# Patient Record
Sex: Female | Born: 1975 | Race: White | Hispanic: No | Marital: Single | Smoking: Current every day smoker
Health system: Southern US, Community
[De-identification: ages and names within clinical notes are randomized; demographics above are authoritative.]

## PROBLEM LIST (undated history)

## (undated) HISTORY — PX: TONSILLECTOMY: SUR1361

## (undated) HISTORY — PX: APPENDECTOMY: SHX54

---

## 2018-12-12 ENCOUNTER — Other Ambulatory Visit: Payer: Self-pay

## 2018-12-12 DIAGNOSIS — Z20822 Contact with and (suspected) exposure to covid-19: Secondary | ICD-10-CM

## 2018-12-13 LAB — NOVEL CORONAVIRUS, NAA: SARS-CoV-2, NAA: NOT DETECTED

## 2019-04-11 ENCOUNTER — Ambulatory Visit: Payer: Self-pay

## 2019-05-30 ENCOUNTER — Inpatient Hospital Stay (HOSPITAL_COMMUNITY)
Admission: AD | Admit: 2019-05-30 | Discharge: 2019-05-30 | Disposition: A | Discharge status: Home / Self Care | Payer: Medicaid Other | Attending: Obstetrics and Gynecology | Admitting: Obstetrics and Gynecology

## 2019-05-30 ENCOUNTER — Other Ambulatory Visit: Payer: Self-pay

## 2019-05-30 ENCOUNTER — Encounter (HOSPITAL_COMMUNITY): Payer: Self-pay | Admitting: Obstetrics and Gynecology

## 2019-05-30 ENCOUNTER — Inpatient Hospital Stay (HOSPITAL_COMMUNITY): Payer: Medicaid Other

## 2019-05-30 DIAGNOSIS — Z3A01 Less than 8 weeks gestation of pregnancy: Secondary | ICD-10-CM | POA: Diagnosis not present

## 2019-05-30 DIAGNOSIS — O039 Complete or unspecified spontaneous abortion without complication: Secondary | ICD-10-CM | POA: Diagnosis not present

## 2019-05-30 DIAGNOSIS — F17210 Nicotine dependence, cigarettes, uncomplicated: Secondary | ICD-10-CM | POA: Diagnosis not present

## 2019-05-30 LAB — CBC
HCT: 36.7 % (ref 36.0–46.0)
Hemoglobin: 12.5 g/dL (ref 12.0–15.0)
MCH: 30.9 pg (ref 26.0–34.0)
MCHC: 34.1 g/dL (ref 30.0–36.0)
MCV: 90.8 fL (ref 80.0–100.0)
Platelets: 233 10*3/uL (ref 150–400)
RBC: 4.04 MIL/uL (ref 3.87–5.11)
RDW: 11.9 % (ref 11.5–15.5)
WBC: 7.4 10*3/uL (ref 4.0–10.5)
nRBC: 0 % (ref 0.0–0.2)

## 2019-05-30 LAB — TYPE AND SCREEN
ABO/RH(D): O NEG
Antibody Screen: POSITIVE

## 2019-05-30 LAB — HCG, QUANTITATIVE, PREGNANCY: hCG, Beta Chain, Quant, S: 14798 m[IU]/mL — ABNORMAL HIGH (ref <5)

## 2019-05-30 MED ORDER — ACETAMINOPHEN 500 MG PO TABS
1000.0000 mg | ORAL_TABLET | Freq: Once | ORAL | Status: AC
  Administered 2019-05-30: 1000 mg via ORAL
  Filled 2019-05-30: qty 2

## 2019-05-30 NOTE — MAU Provider Note (Signed)
History     CSN: [id number]  Arrival date and time: 05/30/19 1830  Chief Complaint  Patient presents with  . Vaginal Bleeding   HPI Erika Bryant is a 44 y.o. [id number] who presents with heavy vaginal bleeding. She was diagnosed with a missed AB on 5/19 that measured approximately 7 weeks. She chose expectant management and started bleeding heavily today. She reports passing golfball sized clots.  OB History    Gravida  6   Para  4   Term  4   Preterm      AB  1   Living  4     SAB  1   TAB      Ectopic      Multiple      Live Births  4           History reviewed. No pertinent past medical history.  Past Surgical History:  Procedure Laterality Date  . APPENDECTOMY    . TONSILLECTOMY      History reviewed. No pertinent family history.  Social History   Tobacco Use  . Smoking status: Current Every Day Smoker    Years: 20.00    Types: Cigarettes  . Smokeless tobacco: Never Used  Substance Use Topics  . Alcohol use: Not Currently  . Drug use: Never    Allergies: Not on File  No medications prior to admission.    Review of Systems  Constitutional: Negative.  Negative for fatigue and fever.  HENT: Negative.   Respiratory: Negative.  Negative for shortness of breath.   Cardiovascular: Negative.  Negative for chest pain.  Gastrointestinal: Positive for abdominal pain. Negative for constipation, diarrhea, nausea and vomiting.  Genitourinary: Positive for vaginal bleeding. Negative for dysuria.  Neurological: Negative.  Negative for dizziness and headaches.   Physical Exam   Blood pressure 110/62, pulse (!) 111, temperature 98.4 F (36.9 C), resp. rate 18.  Physical Exam  Nursing note and vitals reviewed. Constitutional: She is oriented to person, place, and time. She appears well-developed and well-nourished. No distress.  HENT:  Head: Normocephalic.  Eyes: Pupils are equal, round, and reactive to light.  Cardiovascular: Normal rate,  regular rhythm and normal heart sounds.  Respiratory: Effort normal and breath sounds normal. No respiratory distress.  GI: Soft. Bowel sounds are normal. She exhibits no distension. There is no abdominal tenderness.  Genitourinary:    Genitourinary Comments: Moderate amount of bright red blood and clots evacuated from vaginal vault. Small trickle of blood from cervical os.    Neurological: She is alert and oriented to person, place, and time.  Skin: Skin is warm and dry.  Psychiatric: She has a normal mood and affect. Her behavior is normal. Judgment and thought content normal.    MAU Course  Procedures Results for orders placed or performed during the hospital encounter of 05/30/19 (from the past 24 hour(s))  CBC     Status: None   Collection Time: 05/30/19  7:10 PM  Result Value Ref Range   WBC 7.4 4.0 - 10.5 K/uL   RBC 4.04 3.87 - 5.11 MIL/uL   Hemoglobin 12.5 12.0 - 15.0 g/dL   HCT 36.7 36.0 - 46.0 %   MCV 90.8 80.0 - 100.0 fL   MCH 30.9 26.0 - 34.0 pg   MCHC 34.1 30.0 - 36.0 g/dL   RDW 11.9 11.5 - 15.5 %   Platelets 233 150 - 400 K/uL   nRBC 0.0 0.0 - 0.2 %  hCG, quantitative, pregnancy  Status: Abnormal   Collection Time: 05/30/19  7:10 PM  Result Value Ref Range   hCG, Beta Chain, Quant, S 14,798 (H) <5 mIU/mL  Type and screen [facility]     Status: None (Preliminary result)   Collection Time: 05/30/19  7:10 PM  Result Value Ref Range   ABO/RH(D) O NEG    Antibody Screen POS    Sample Expiration      06/02/2019,2359 Performed at [facility], [address] [place], [place] [address]    Antibody Identification PENDING    US OB Transvaginal  Result Date: 05/30/2019 CLINICAL DATA:  Heavy bleeding EXAM: OBSTETRIC <14 WK [place] AND TRANSVAGINAL OB US TECHNIQUE: Both transabdominal and transvaginal ultrasound examinations were performed for complete evaluation of the gestation as well as the maternal uterus, adnexal regions, and pelvic cul-de-sac.  Transvaginal technique was performed to assess early pregnancy. COMPARISON:  None. FINDINGS: Intrauterine gestational sac: None Maternal uterus/adnexae: The ovaries are unremarkable. Endometrium appears thickened with some mild vascularity. There is a trace amount of pelvic free fluid. IMPRESSION: 1. No IUP identified. 2. Thickened, heterogeneous endometrium. Findings are nonspecific and could indicate endometrial blood products although hypovascular retained products of conception cannot be fully excluded. Consider short-term follow-up pelvic ultrasound or further evaluation with pelvic MRI without and with IV contrast, as clinically warranted. Electronically Signed   By: Katherine Mantle M.D.   On: 05/30/2019 20:14   MDM Results reviewed from visit on 5/19. UA CBC HCG Type and screen- O Neg, received Rhogam in the office on 5/19 US OB transvaginal  Reviewed results with patient at length. Patient reassured by bloodwork and ultrasound. Discussed options of cytotec and surgery and patient verbalized understanding but desires to continue expectant management. Warning signs reviewed at length with patient including when to return for bleeding or pain. Patient has follow up already scheduled in office early next week.   Assessment and Plan   1. Miscarriage    -Discharge home in stable condition -Vaginal bleeding and pain precautions discussed -Patient advised to follow-up with [facility] as scheduled next week -Patient may return to MAU as needed or if her condition were to change or worsen   Rolm Bookbinder CNM 05/30/2019, 8:54 PM

## 2019-05-30 NOTE — MAU Note (Signed)
Erika Bryant is a 44 y.o.  here in MAU reporting:  That she was seen on Wednesday and told that she was having a miscarriage. +vaginal bleeding  Started on Sunday. Has progressively gotten heavier. Today is soaking through pads into her pants.  Reports that she is passing large amounts of tissue.  +dizziness.  LMP: 3/15  Pain score: 5/10. Has not taken anything for the pain.  Vitals:   05/30/19 1847  BP: (!) 147/95  Pulse: (!) 111  Resp: 18

## 2019-05-30 NOTE — Discharge Instructions (Signed)
Miscarriage A miscarriage is the loss of an unborn baby (fetus) before the 20th week of pregnancy. Follow these instructions at home: Medicines   Take over-the-counter and prescription medicines only as told by your doctor.  If you were prescribed antibiotic medicine, take it as told by your doctor. Do not stop taking the antibiotic even if you start to feel better.  Do not take NSAIDs unless your doctor says that this is safe for you. NSAIDs include aspirin and ibuprofen. These medicines can cause bleeding. Activity  Rest as directed. Ask your doctor what activities are safe for you.  Have someone help you at home during this time. General instructions  Write down how many pads you use each day and how soaked they are.  Watch the amount of tissue or clumps of blood (blood clots) that you pass from your vagina. Save any large amounts of tissue for your doctor.  Do not use tampons, douche, or have sex until your doctor approves.  To help you and your partner with the process of grieving, talk with your doctor or seek counseling.  When you are ready, meet with your doctor to talk about steps you should take for your health. Also, talk with your doctor about steps to take to have a healthy pregnancy in the future.  Keep all follow-up visits as told by your doctor. This is important. Contact a doctor if:  You have a fever or chills.  You have vaginal discharge that smells bad.  You have more bleeding. Get help right away if:  You have very bad cramps or pain in your back or belly.  You pass clumps of blood that are walnut-sized or larger from your vagina.  You pass tissue that is walnut-sized or larger from your vagina.  You soak more than 1 regular pad in an hour.  You get light-headed or weak.  You faint (pass out).  You have feelings of sadness that do not go away, or you have thoughts of hurting yourself. Summary  A miscarriage is the loss of an unborn baby before  the 20th week of pregnancy.  Follow your doctor's instructions for home care. Keep all follow-up appointments.  To help you and your partner with the process of grieving, talk with your doctor or seek counseling. This information is not intended to replace advice given to you by your health care provider. Make sure you discuss any questions you have with your health care provider. Document Revised: 04/18/2018 Document Reviewed: 01/31/2016 Elsevier Patient Education  2020 [employer]vier Inc[employer]  

## 2020-06-23 ENCOUNTER — Emergency Department (HOSPITAL_COMMUNITY)
Admission: EM | Admit: 2020-06-23 | Discharge: 2020-06-24 | Disposition: A | Discharge status: Home / Self Care | Payer: Medicaid Other | Attending: Emergency Medicine | Admitting: Emergency Medicine

## 2020-06-23 ENCOUNTER — Emergency Department (HOSPITAL_COMMUNITY): Payer: Medicaid Other

## 2020-06-23 DIAGNOSIS — R202 Paresthesia of skin: Secondary | ICD-10-CM | POA: Insufficient documentation

## 2020-06-23 DIAGNOSIS — R002 Palpitations: Secondary | ICD-10-CM | POA: Diagnosis not present

## 2020-06-23 DIAGNOSIS — R42 Dizziness and giddiness: Secondary | ICD-10-CM | POA: Insufficient documentation

## 2020-06-23 DIAGNOSIS — R0602 Shortness of breath: Secondary | ICD-10-CM | POA: Insufficient documentation

## 2020-06-23 DIAGNOSIS — F17210 Nicotine dependence, cigarettes, uncomplicated: Secondary | ICD-10-CM | POA: Insufficient documentation

## 2020-06-23 DIAGNOSIS — R079 Chest pain, unspecified: Secondary | ICD-10-CM

## 2020-06-23 DIAGNOSIS — R0789 Other chest pain: Secondary | ICD-10-CM | POA: Diagnosis present

## 2020-06-23 LAB — BASIC METABOLIC PANEL
Anion gap: 9 (ref 5–15)
BUN: 9 mg/dL (ref 6–20)
CO2: 23 mmol/L (ref 22–32)
Calcium: 8.8 mg/dL — ABNORMAL LOW (ref 8.9–10.3)
Chloride: 107 mmol/L (ref 98–111)
Creatinine, Ser: 1.01 mg/dL — ABNORMAL HIGH (ref 0.44–1.00)
GFR, Estimated: >60 mL/min (ref >60)
Glucose, Bld: 122 mg/dL — ABNORMAL HIGH (ref 70–99)
Potassium: 3.5 mmol/L (ref 3.5–5.1)
Sodium: 139 mmol/L (ref 135–145)

## 2020-06-23 LAB — CBC
HCT: 41.7 % (ref 36.0–46.0)
Hemoglobin: 13.7 g/dL (ref 12.0–15.0)
MCH: 30.5 pg (ref 26.0–34.0)
MCHC: 32.9 g/dL (ref 30.0–36.0)
MCV: 92.9 fL (ref 80.0–100.0)
Platelets: 254 10*3/uL (ref 150–400)
RBC: 4.49 MIL/uL (ref 3.87–5.11)
RDW: 12.6 % (ref 11.5–15.5)
WBC: 10.6 10*3/uL — ABNORMAL HIGH (ref 4.0–10.5)
nRBC: 0 % (ref 0.0–0.2)

## 2020-06-23 LAB — TROPONIN I (HIGH SENSITIVITY): Troponin I (High Sensitivity): 3 ng/L (ref <18)

## 2020-06-23 LAB — I-STAT BETA HCG BLOOD, ED (MC, WL, AP ONLY): I-stat hCG, quantitative: <5 m[IU]/mL (ref <5)

## 2020-06-23 NOTE — ED Triage Notes (Signed)
Pt c/o CP, tingling to L arm x3days, states lightheaded episode after coughing spell brought her into ED. Denies CV hx. Denies taking medication for symptoms

## 2020-06-23 NOTE — ED Provider Notes (Signed)
Emergency Medicine Provider Triage Evaluation Note  Erika Bryant , a 45 y.o. female  was evaluated in triage.  Pt complains of intermittent, momentary episodes of chest pain over the last 3 days.  Accompanied by occasional lightheadedness.  Review of Systems  Positive: Chest pain Negative: Shortness of breath, cough, syncope, abdominal pain  Physical Exam  BP (!) 142/83   Pulse (!) 108   Temp 99 F (37.2 C) (Oral)   Resp 16   SpO2 99%   Gen:   Awake, no distress   Resp:  Normal effort  MSK:   Moves extremities without difficulty  Other:    Medical Decision Making  Medically screening exam initiated at 8:54 PM.  Appropriate orders placed.  [name] was informed that the remainder of the evaluation will be completed by another provider, this initial triage assessment does not replace that evaluation, and the importance of remaining in the ED until their evaluation is complete.     Anselm Pancoast, PA-C 06/23/20 2118    Rozelle Logan, DO 06/23/20 2220

## 2020-06-24 ENCOUNTER — Other Ambulatory Visit: Payer: Self-pay

## 2020-06-24 LAB — TROPONIN I (HIGH SENSITIVITY): Troponin I (High Sensitivity): 3 ng/L (ref <18)

## 2020-06-24 LAB — D-DIMER, QUANTITATIVE: D-Dimer, Quant: <0.27 ug/mL-FEU (ref 0.00–0.50)

## 2020-06-24 MED ORDER — ACETAMINOPHEN 500 MG PO TABS
500.0000 mg | ORAL_TABLET | Freq: Once | ORAL | Status: AC
  Administered 2020-06-24: 500 mg via ORAL
  Filled 2020-06-24: qty 1

## 2020-06-24 NOTE — ED Provider Notes (Signed)
[facility] EMERGENCY DEPARTMENT Provider Note   CSN: [id number] Arrival date & time: 06/23/20  2031     History Chief Complaint  Patient presents with   Chest Pain    Erika Bryant is a 45 y.o. female.  Patient reports that 1 week ago she had a coughing spell and then noticed sudden onset pain just left of her sternum and her chest.  She reports that she then noticed heart palpitations, shortness of breath, lightheadedness.  She reports that she was able to calm herself down and the symptoms resolved.  She reports that over the last week every day she has had issues with this pain as well as occasional heart palpitations and shortness of breath.  Patient talks about episodes of general chest pain that have been going on for a while where she feels like she can hear a drum beating in her ears but it is just her heart.  She reports that the pain is throughout her chest and she notices left arm numbness.  Patient reports that she does have issues with panic attacks but is unmedicated or treated for it.  She does not have a primary care provider.  Patient reports she does smoke and has smoked for years but she is quitting.  Denies any other health issues.  Patient reports she has not taken anything for this pain.      No past medical history on file.  There are no problems to display for this patient.   Past Surgical History:  Procedure Laterality Date   APPENDECTOMY     TONSILLECTOMY       OB History     Gravida  6   Para  4   Term  4   Preterm      AB  1   Living  4      SAB  1   IAB      Ectopic      Multiple      Live Births  4           No family history on file.  Social History   Tobacco Use   Smoking status: Every Day    Years: 20.00    Pack years: 0.00    Types: Cigarettes   Smokeless tobacco: Never  Substance Use Topics   Alcohol use: Not Currently   Drug use: Never    Home Medications Prior to Admission  medications   Not on File    Allergies    Patient has no allergy information on record.  Review of Systems   Review of Systems  Constitutional:  Negative for appetite change, chills and fever.  HENT:  Negative for rhinorrhea.   Respiratory:  Positive for cough and shortness of breath.   Cardiovascular:  Positive for chest pain (Palpable) and palpitations.  Gastrointestinal:  Negative for abdominal pain, constipation, diarrhea, nausea and vomiting.  Endocrine: Negative for polyuria.  Genitourinary:  Negative for dysuria.  Musculoskeletal:  Positive for arthralgias and back pain.  Skin:  Negative for rash.  Neurological:  Positive for light-headedness. Negative for syncope.  Psychiatric/Behavioral:  The patient is nervous/anxious.    Physical Exam Updated Vital Signs BP 120/78   Pulse 61   Temp 98.7 F (37.1 C)   Resp 10   SpO2 100%   Physical Exam Constitutional:      Appearance: She is well-developed and normal weight.  HENT:     Head: Normocephalic and atraumatic.  Eyes:  Extraocular Movements: Extraocular movements intact.  Cardiovascular:     Rate and Rhythm: Normal rate and regular rhythm.     Heart sounds: Normal heart sounds. Heart sounds not distant. No murmur heard. Pulmonary:     Effort: Pulmonary effort is normal. No tachypnea or respiratory distress.     Breath sounds: Normal breath sounds.  Chest:     Chest wall: Tenderness (Tenderness to palpation on left costo sternal junction at sixth/seventh rib) present.  Abdominal:     General: Bowel sounds are normal.     Palpations: Abdomen is soft.  Musculoskeletal:     Cervical back: Normal range of motion.     Right lower leg: No edema.     Left lower leg: No edema.  Skin:    General: Skin is warm and dry.     Capillary Refill: Capillary refill takes less than 2 seconds.  Neurological:     General: No focal deficit present.     Mental Status: She is alert.  Psychiatric:        Mood and Affect: Mood  normal.    ED Results / Procedures / Treatments   Labs (all labs ordered are listed, but only abnormal results are displayed) Labs Reviewed  BASIC METABOLIC PANEL - Abnormal; Notable for the following components:      Result Value   Glucose, Bld 122 (*)    Creatinine, Ser 1.01 (*)    Calcium 8.8 (*)    All other components within normal limits  CBC - Abnormal; Notable for the following components:   WBC 10.6 (*)    All other components within normal limits  D-DIMER, QUANTITATIVE  I-STAT BETA HCG BLOOD, ED ([facility], [facility], [facility] ONLY)  TROPONIN I (HIGH SENSITIVITY)  TROPONIN I (HIGH SENSITIVITY)    EKG EKG Interpretation  Date/Time:  Friday June 24 2020 08:41:58 EDT Ventricular Rate:  59 PR Interval:  174 QRS Duration: 95 QT Interval:  420 QTC Calculation: 416 R Axis:   88 Text Interpretation: Sinus rhythm Normal ECG Confirmed by Gwyneth Sprout ([id number]) on 06/24/2020 9:03:05 AM  Radiology DG Chest 2 View  Result Date: 06/23/2020 CLINICAL DATA:  Chest pain EXAM: CHEST - 2 VIEW COMPARISON:  None. FINDINGS: The heart size and mediastinal contours are within normal limits. Both lungs are clear. The visualized skeletal structures are unremarkable. IMPRESSION: No active cardiopulmonary disease. Electronically Signed   By: Deatra Robinson M.D.   On: 06/23/2020 21:35    Procedures Procedures   Medications Ordered in ED Medications  acetaminophen (TYLENOL) tablet 500 mg (500 mg Oral Given 06/24/20 1012)    ED Course  I have reviewed the triage vital signs and the nursing notes.  Pertinent labs & imaging results that were available during my care of the patient were reviewed by me and considered in my medical decision making (see chart for details).    MDM Rules/Calculators/A&P                          Patient presents the emergency department with 1 week history of intermittent chest pain.  She reports that this palpable chest pain.  On arrival vital signs are within normal limits.   Initial EKG showed mild depression of ST segments in anterior lateral leads but repeat EKG showed normal sinus rhythm.  Patient's troponins were trended and remained at 3.  Chest x-ray within normal limits.  Feel that the palpable pain in her chest is most likely  costochondritis.  Provided patient with Tylenol and recommended Tylenol as needed for the discomfort.  I also think that there is a component of anxiety/panic attacks involved.  She reports that when she notices the pain in her chest she then notices heart palpitations, lightheadedness, shortness of breath.  Patient's major risk factor for clotting is her smoking history.  We collected a D-dimer just to rule out any kind of clot.  D-dimer is negative.  Reports that she has history of panic attacks and is not on treatment for this.  Recommended that the patient establish with a primary care so she can receive treatment for this.  Transitions of care consulted for assistance in lining up PCP.  Patient was discharged with strict return precautions. Final Clinical Impression(s) / ED Diagnoses Final diagnoses:  Chest pain, unspecified type    Rx / DC Orders ED Discharge Orders     None        Derrel Nip, MD 06/24/20 1140    Gwyneth Sprout, MD 06/24/20 1409

## 2020-06-24 NOTE — Discharge Instructions (Addendum)
It was a pleasure meeting you today.  I am sorry you are having these issues with your chest pain.  The tenderness to palpation is most likely something called costochondritis.  Below is information on this.  We did check your heart out with an EKG which came back normal.  We also completed a chest x-ray which was completely normal as well.  We also checked for clotting and those labs came back negative as well.  We have placed an order for transitions of care to help you get established with a primary care provider.  If you have worsening symptoms please feel free to come back and be reevaluated.  I hope you have a wonderful afternoon!

## 2021-12-25 IMAGING — US US OB TRANSVAGINAL
1 series · 15 of 25 positions shown · non-contrast
Comparison: None.

CLINICAL DATA: Heavy bleeding

EXAM:
OBSTETRIC <14 WK US AND TRANSVAGINAL OB US
TECHNIQUE: Both transabdominal and transvaginal ultrasound examinations were
performed for complete evaluation of the gestation as well as the
maternal uterus, adnexal regions, and pelvic cul-de-sac.
Transvaginal technique was performed to assess early pregnancy.

[Series 1: us ob transvaginal · 25 acquisitions, 15 frames shown]
[im 1/25]
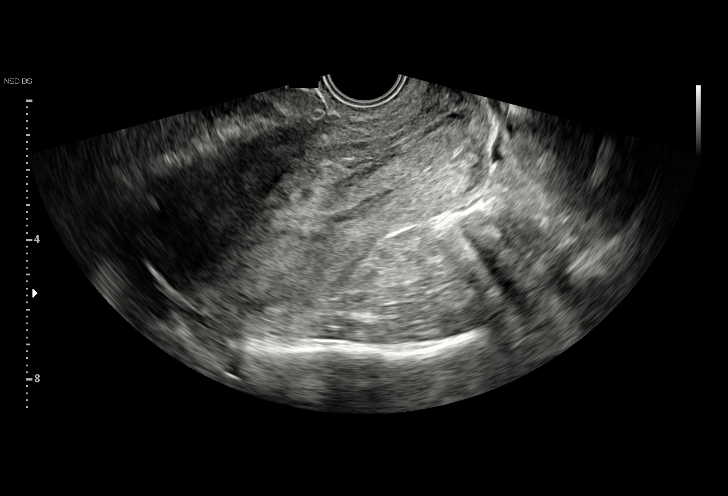
[im 3/25]
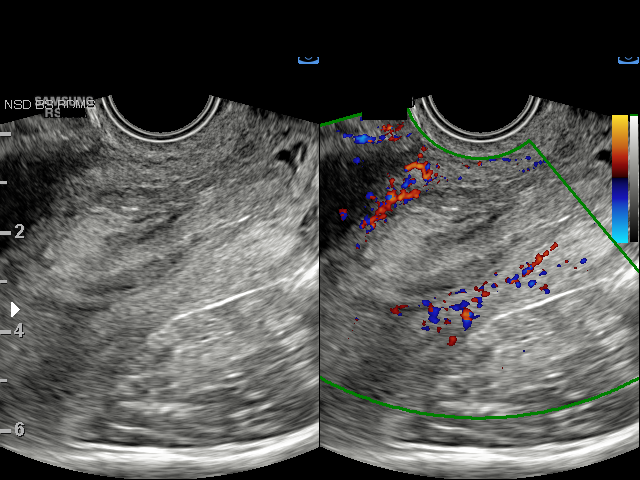
[im 5/25]
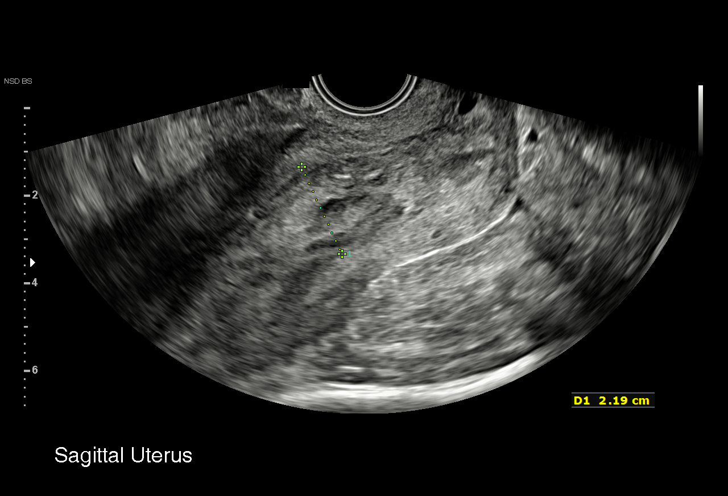
[im 6/25]
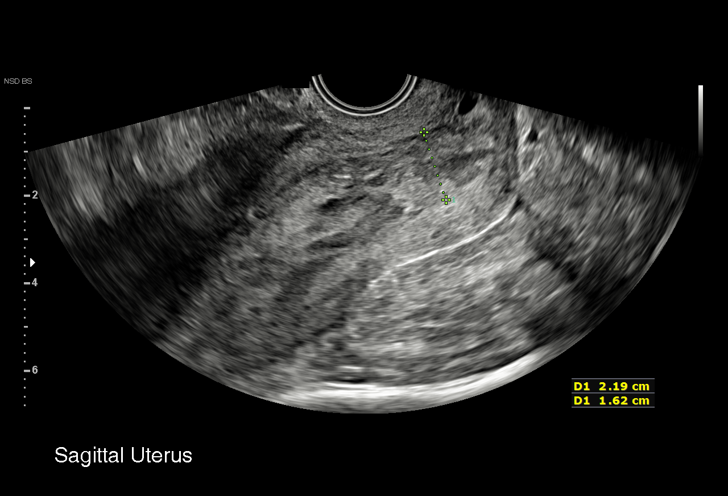
[im 8/25]
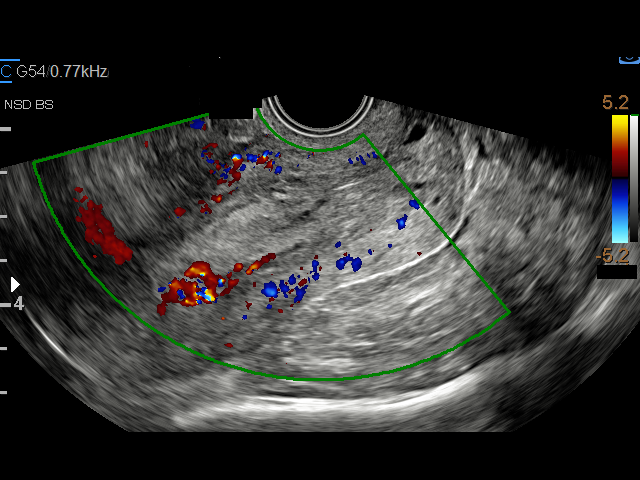
[im 10/25]
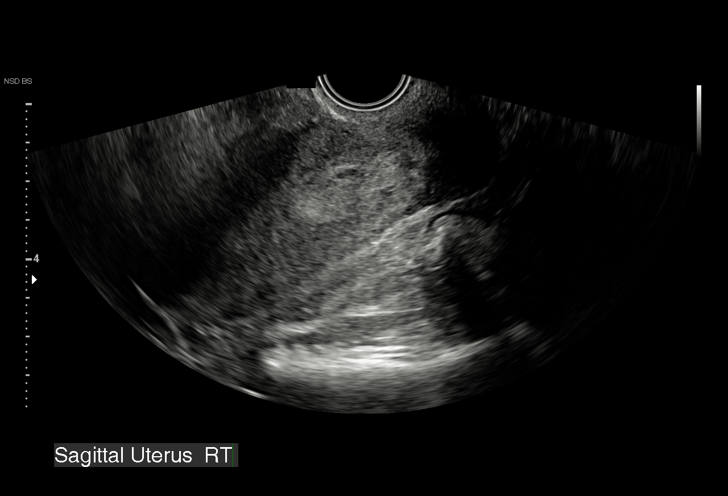
[im 11/25]
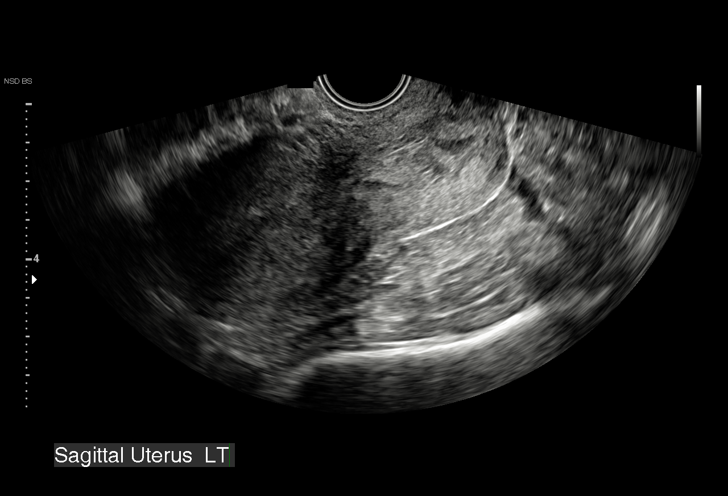
[im 13/25]
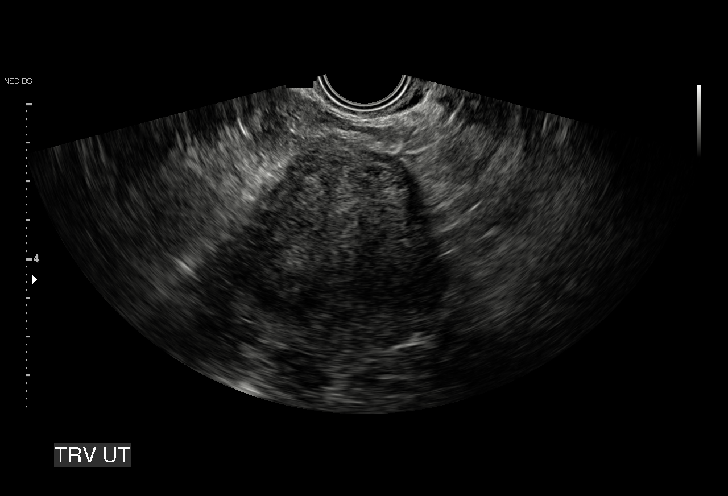
[im 15/25]
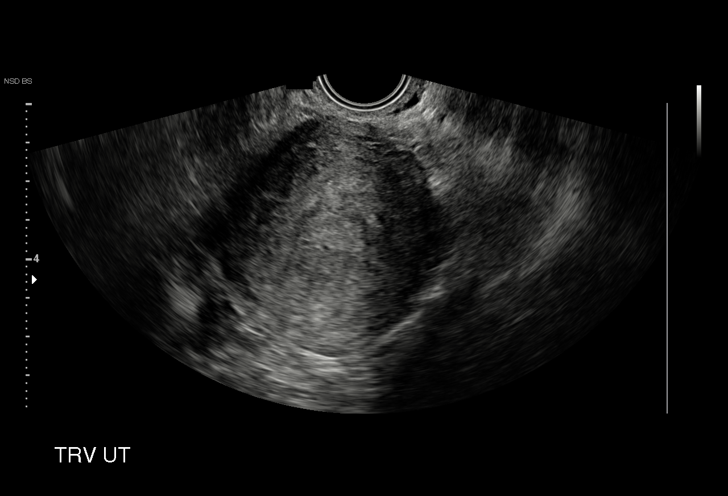
[im 16/25]
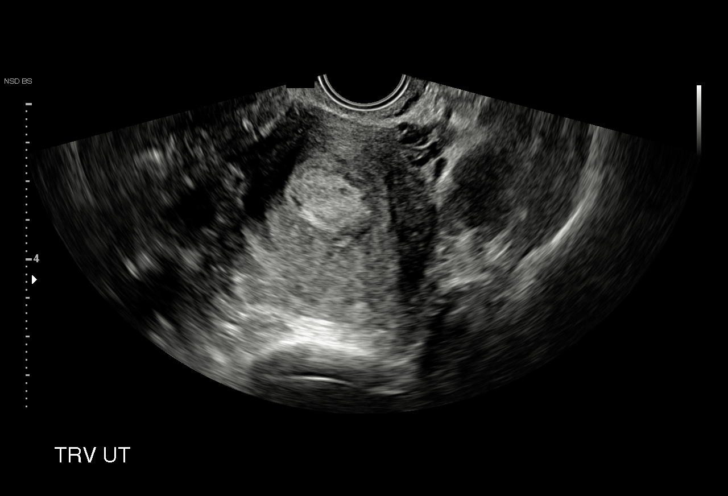
[im 18/25]
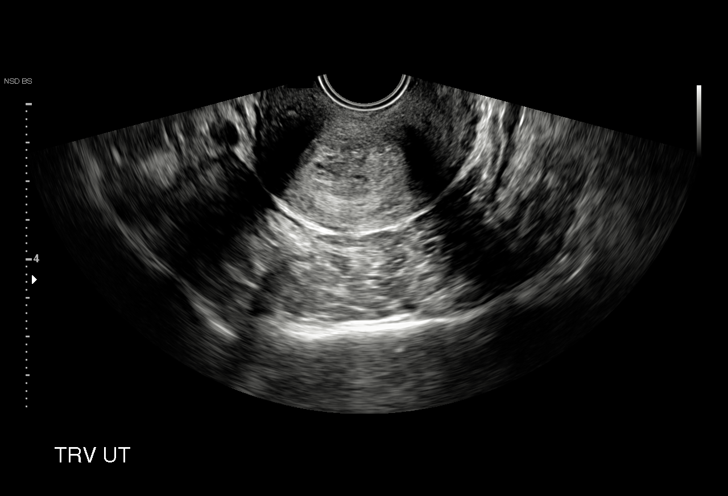
[im 20/25]
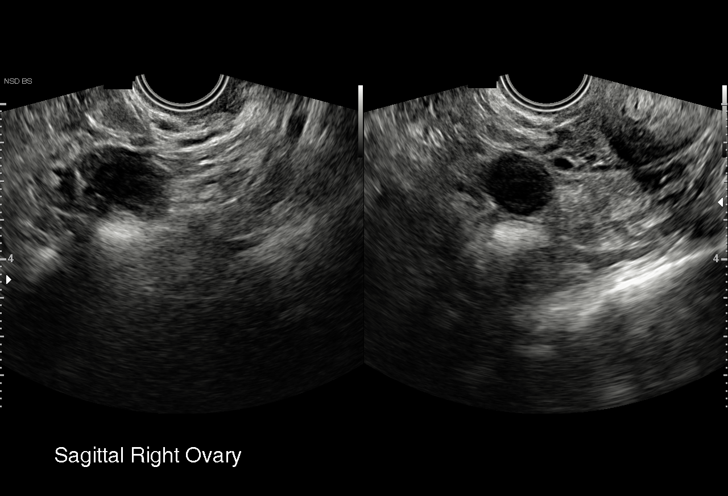
[im 21/25]
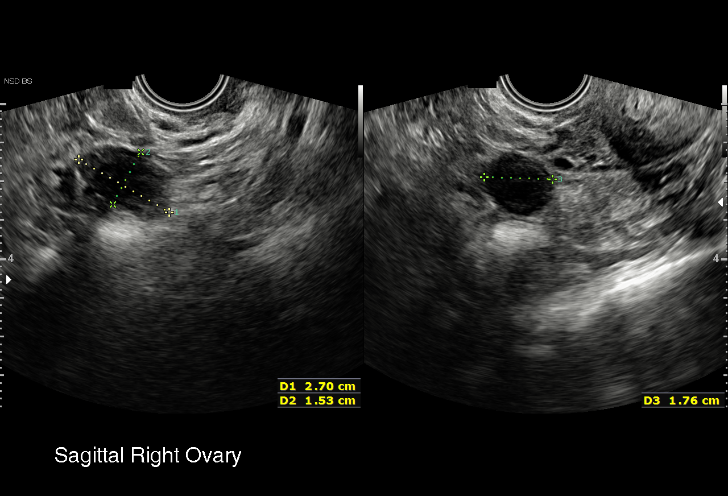
[im 23/25]
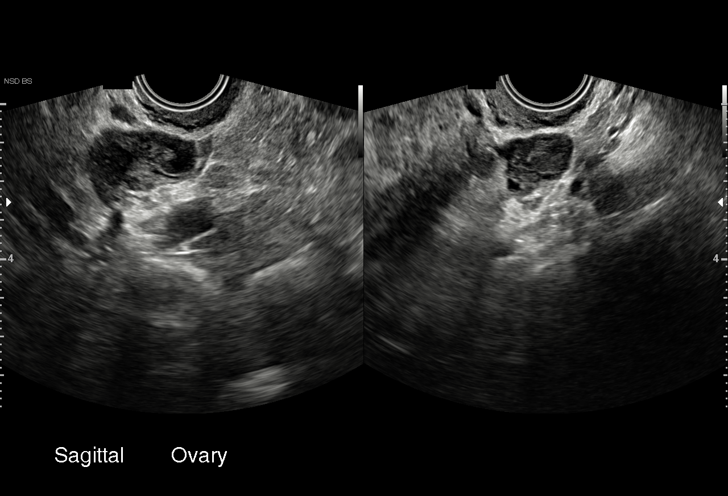
[im 25/25]
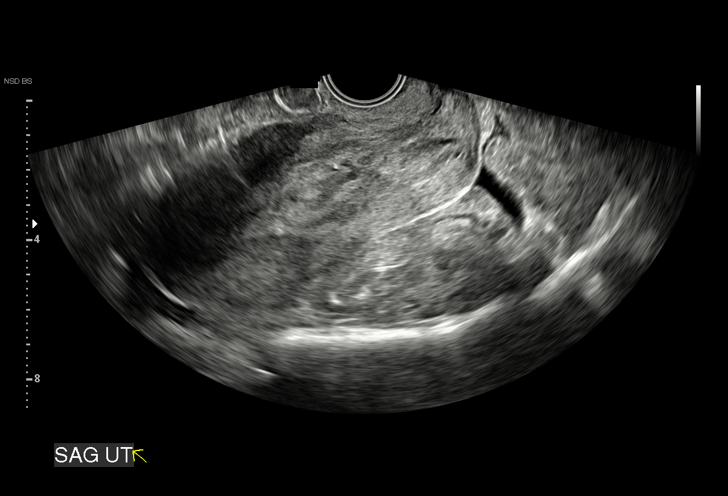

[15 of 25 positions shown; findings below may reference images not displayed]

FINDINGS: Intrauterine gestational sac: None

Maternal uterus/adnexae: The ovaries are unremarkable. Endometrium
appears thickened with some mild vascularity. There is a trace
amount of pelvic free fluid.
IMPRESSION: 1. No IUP identified.
2. Thickened, heterogeneous endometrium. Findings are nonspecific
and could indicate endometrial blood products although hypovascular
retained products of conception cannot be fully excluded. Consider
short-term follow-up pelvic ultrasound or further evaluation with
pelvic MRI without and with IV contrast, as clinically warranted.

## 2022-01-29 ENCOUNTER — Other Ambulatory Visit: Payer: Self-pay

## 2022-01-29 ENCOUNTER — Emergency Department (HOSPITAL_COMMUNITY): Payer: Medicaid Other

## 2022-01-29 ENCOUNTER — Emergency Department (HOSPITAL_COMMUNITY)
Admission: EM | Admit: 2022-01-29 | Discharge: 2022-01-29 | Disposition: A | Discharge status: Home / Self Care | Payer: Medicaid Other | Attending: Emergency Medicine | Admitting: Emergency Medicine

## 2022-01-29 DIAGNOSIS — R079 Chest pain, unspecified: Secondary | ICD-10-CM | POA: Diagnosis not present

## 2022-01-29 DIAGNOSIS — R002 Palpitations: Secondary | ICD-10-CM | POA: Insufficient documentation

## 2022-01-29 LAB — BASIC METABOLIC PANEL
Anion gap: 10 (ref 5–15)
BUN: 8 mg/dL (ref 6–20)
CO2: 23 mmol/L (ref 22–32)
Calcium: 8.6 mg/dL — ABNORMAL LOW (ref 8.9–10.3)
Chloride: 106 mmol/L (ref 98–111)
Creatinine, Ser: 0.88 mg/dL (ref 0.44–1.00)
GFR, Estimated: >60 mL/min (ref >60)
Glucose, Bld: 92 mg/dL (ref 70–99)
Potassium: 3.6 mmol/L (ref 3.5–5.1)
Sodium: 139 mmol/L (ref 135–145)

## 2022-01-29 LAB — CBC
HCT: 44.9 % (ref 36.0–46.0)
Hemoglobin: 14.9 g/dL (ref 12.0–15.0)
MCH: 30.7 pg (ref 26.0–34.0)
MCHC: 33.2 g/dL (ref 30.0–36.0)
MCV: 92.4 fL (ref 80.0–100.0)
Platelets: 239 10*3/uL (ref 150–400)
RBC: 4.86 MIL/uL (ref 3.87–5.11)
RDW: 12.3 % (ref 11.5–15.5)
WBC: 6.5 10*3/uL (ref 4.0–10.5)
nRBC: 0 % (ref 0.0–0.2)

## 2022-01-29 LAB — D-DIMER, QUANTITATIVE: D-Dimer, Quant: 0.33 ug/mL-FEU (ref 0.00–0.50)

## 2022-01-29 LAB — TSH: TSH: 1.686 u[IU]/mL (ref 0.350–4.500)

## 2022-01-29 LAB — I-STAT BETA HCG BLOOD, ED (MC, WL, AP ONLY): I-stat hCG, quantitative: <5 m[IU]/mL (ref <5)

## 2022-01-29 LAB — T4, FREE: Free T4: 1 ng/dL (ref 0.61–1.12)

## 2022-01-29 LAB — TROPONIN I (HIGH SENSITIVITY)
Troponin I (High Sensitivity): <2 ng/L (ref <18)
Troponin I (High Sensitivity): <2 ng/L (ref <18)

## 2022-01-29 LAB — LIPASE, BLOOD: Lipase: 35 U/L (ref 11–51)

## 2022-01-29 NOTE — ED Provider Notes (Signed)
[place] [facility] Provider Note   CSN: [id number] Arrival date & time: 01/29/22  1320     History  Chief Complaint  Patient presents with   Chest Pain    Erika Bryant is a 47 y.o. female.  Patient here with palpitations and chest pain.  Recently had the flu.  She is not having cough or fever or sputum production.  She is worried she might have a blood clot.  She is on estrogen.  No recent surgery.  No cancer history.  No cardiac history.  Does not take any medications.  Denies any exertional pain.  Denies any abdominal pain, nausea, vomiting.  The history is provided by the patient.       Home Medications Prior to Admission medications   Not on File      Allergies    Patient has no known allergies.    Review of Systems   Review of Systems  Physical Exam Updated Vital Signs BP 128/81 (BP Location: Right Arm)   Pulse 64   Temp 97.9 F (36.6 C) (Oral)   Resp 16   Wt 64.4 kg   LMP 01/27/2022   SpO2 99%  Physical Exam Vitals and nursing note reviewed.  Constitutional:      General: She is not in acute distress.    Appearance: She is well-developed. She is not ill-appearing.  HENT:     Head: Normocephalic and atraumatic.  Eyes:     Extraocular Movements: Extraocular movements intact.     Conjunctiva/sclera: Conjunctivae normal.     Pupils: Pupils are equal, round, and reactive to light.  Cardiovascular:     Rate and Rhythm: Normal rate and regular rhythm.     Pulses:          Radial pulses are 2+ on the right side and 2+ on the left side.     Heart sounds: Normal heart sounds. No murmur heard. Pulmonary:     Effort: Pulmonary effort is normal. No respiratory distress.     Breath sounds: Normal breath sounds. No decreased breath sounds, wheezing or rhonchi.  Abdominal:     Palpations: Abdomen is soft.     Tenderness: There is no abdominal tenderness.  Musculoskeletal:        General: No swelling.     Cervical  back: Normal range of motion and neck supple.  Skin:    General: Skin is warm and dry.     Capillary Refill: Capillary refill takes less than 2 seconds.  Neurological:     General: No focal deficit present.     Mental Status: She is alert.  Psychiatric:        Mood and Affect: Mood normal.     ED Results / Procedures / Treatments   Labs (all labs ordered are listed, but only abnormal results are displayed) Labs Reviewed  BASIC METABOLIC PANEL - Abnormal; Notable for the following components:      Result Value   Calcium 8.6 (*)    All other components within normal limits  LIPASE, BLOOD  CBC  TSH  D-DIMER, QUANTITATIVE  T4, FREE  I-STAT BETA HCG BLOOD, ED ([facility], [facility], [facility] ONLY)  TROPONIN I (HIGH SENSITIVITY)  TROPONIN I (HIGH SENSITIVITY)    EKG EKG Interpretation  Date/Time:  Monday January 29 2022 14:18:43 EST Ventricular Rate:  68 PR Interval:  155 QRS Duration: 91 QT Interval:  389 QTC Calculation: 414 R Axis:   94 Text Interpretation: Sinus  rhythm Borderline right axis deviation Confirmed by Lennice Sites [phone]) on 01/29/2022 4:36:31 PM  Radiology DG Chest 2 View  Result Date: 01/29/2022 CLINICAL DATA:  Right-sided chest pain EXAM: CHEST - 2 VIEW COMPARISON:  Chest radiograph dated 06/23/2020 FINDINGS: Surgical clip projects over the left cervical region. Normal lung volumes. No focal consolidations. No pleural effusion or pneumothorax. The heart size and mediastinal contours are within normal limits. The visualized skeletal structures are unremarkable. IMPRESSION: No active cardiopulmonary disease. Electronically Signed   By: Darrin Nipper M.D.   On: 01/29/2022 14:50    Procedures Procedures    Medications Ordered in ED Medications - No data to display  ED Course/ Medical Decision Making/ A&P                             Medical Decision Making Amount and/or Complexity of Data Reviewed Labs: ordered.   Erika Bryant is here with chest pain.  Normal vitals.   No fever.  Patient is PERC negative, heart score 1.  Overall appears well.  Differential diagnosis likely MSK versus acid reflux related process versus may be stress process.  However she did have recent flu and will get a D-dimer, troponin, basic labs to evaluate for ACS, PE.  Will get chest x-ray to evaluate for pneumonia.  Per my review and interpretation labs no significant anemia or electrolyte abnormality or kidney injury.  Troponin x 1 is unremarkable.  Chest x-ray per my review interpretation shows no evidence of pneumonia or pneumothorax.  Awaiting delta troponin and D-dimer.  Repeat troponin per my review interpretation is unremarkable.  D-dimer normal.  Overall I have no concern for ACS or PE.  My suspicion that this could be anxiety related or MSK related.  Recommend Tylenol.  Discharged in good condition.  Recommend follow-up with primary care doctor.  This chart was dictated using voice recognition software.  Despite best efforts to proofread,  errors can occur which can change the documentation meaning.         Final Clinical Impression(s) / ED Diagnoses Final diagnoses:  Nonspecific chest pain    Rx / DC Orders ED Discharge Orders     None         Lennice Sites, DO 01/29/22 1755

## 2022-01-29 NOTE — ED Triage Notes (Signed)
C/o intermittent right sided chest pain with leaning forward and activity radiating to right arm and right side of neck with lightheaded, fatigue.  Also c/o of palpitations.  Denies sob

## 2022-01-29 NOTE — ED Provider Triage Note (Signed)
Emergency Medicine Provider Triage Evaluation Note  Leonardo Plaia , a 47 y.o. female  was evaluated in triage.  Pt complains of chest pain on the right side over the past few days.  Patient reports it radiates into her neck and down her arm.  No nausea or vomiting.  She reports that she also has some lightheadedness and fatigue.  No belly pain.  No fevers.  No cough or cold symptoms.  Denies any shortness of breath  Review of Systems  Positive:  Negative:   Physical Exam  BP 125/85   Pulse 78   Temp 98.2 F (36.8 C) (Oral)   Resp 17   Wt 64.4 kg   SpO2 100%  Gen:   Awake, no distress   Resp:  Normal effort  MSK:   Moves extremities without difficulty  Other:  Chest none to palpation.  Regular rate and rhythm.  Abdomen nontender to palpation.  Medical Decision Making  Medically screening exam initiated at 2:23 PM.  Appropriate orders placed.  Lerae Langham was informed that the remainder of the evaluation will be completed by another provider, this initial triage assessment does not replace that evaluation, and the importance of remaining in the ED until their evaluation is complete.  Labs and imaging ordered.   Sherrell Puller, [place] 01/29/22 1424

## 2023-01-19 IMAGING — CR DG CHEST 2V
2 series · 2 of 2 positions shown · non-contrast
Comparison: None.

CLINICAL DATA: Chest pain

EXAM:
CHEST - 2 VIEW

[chest pa]
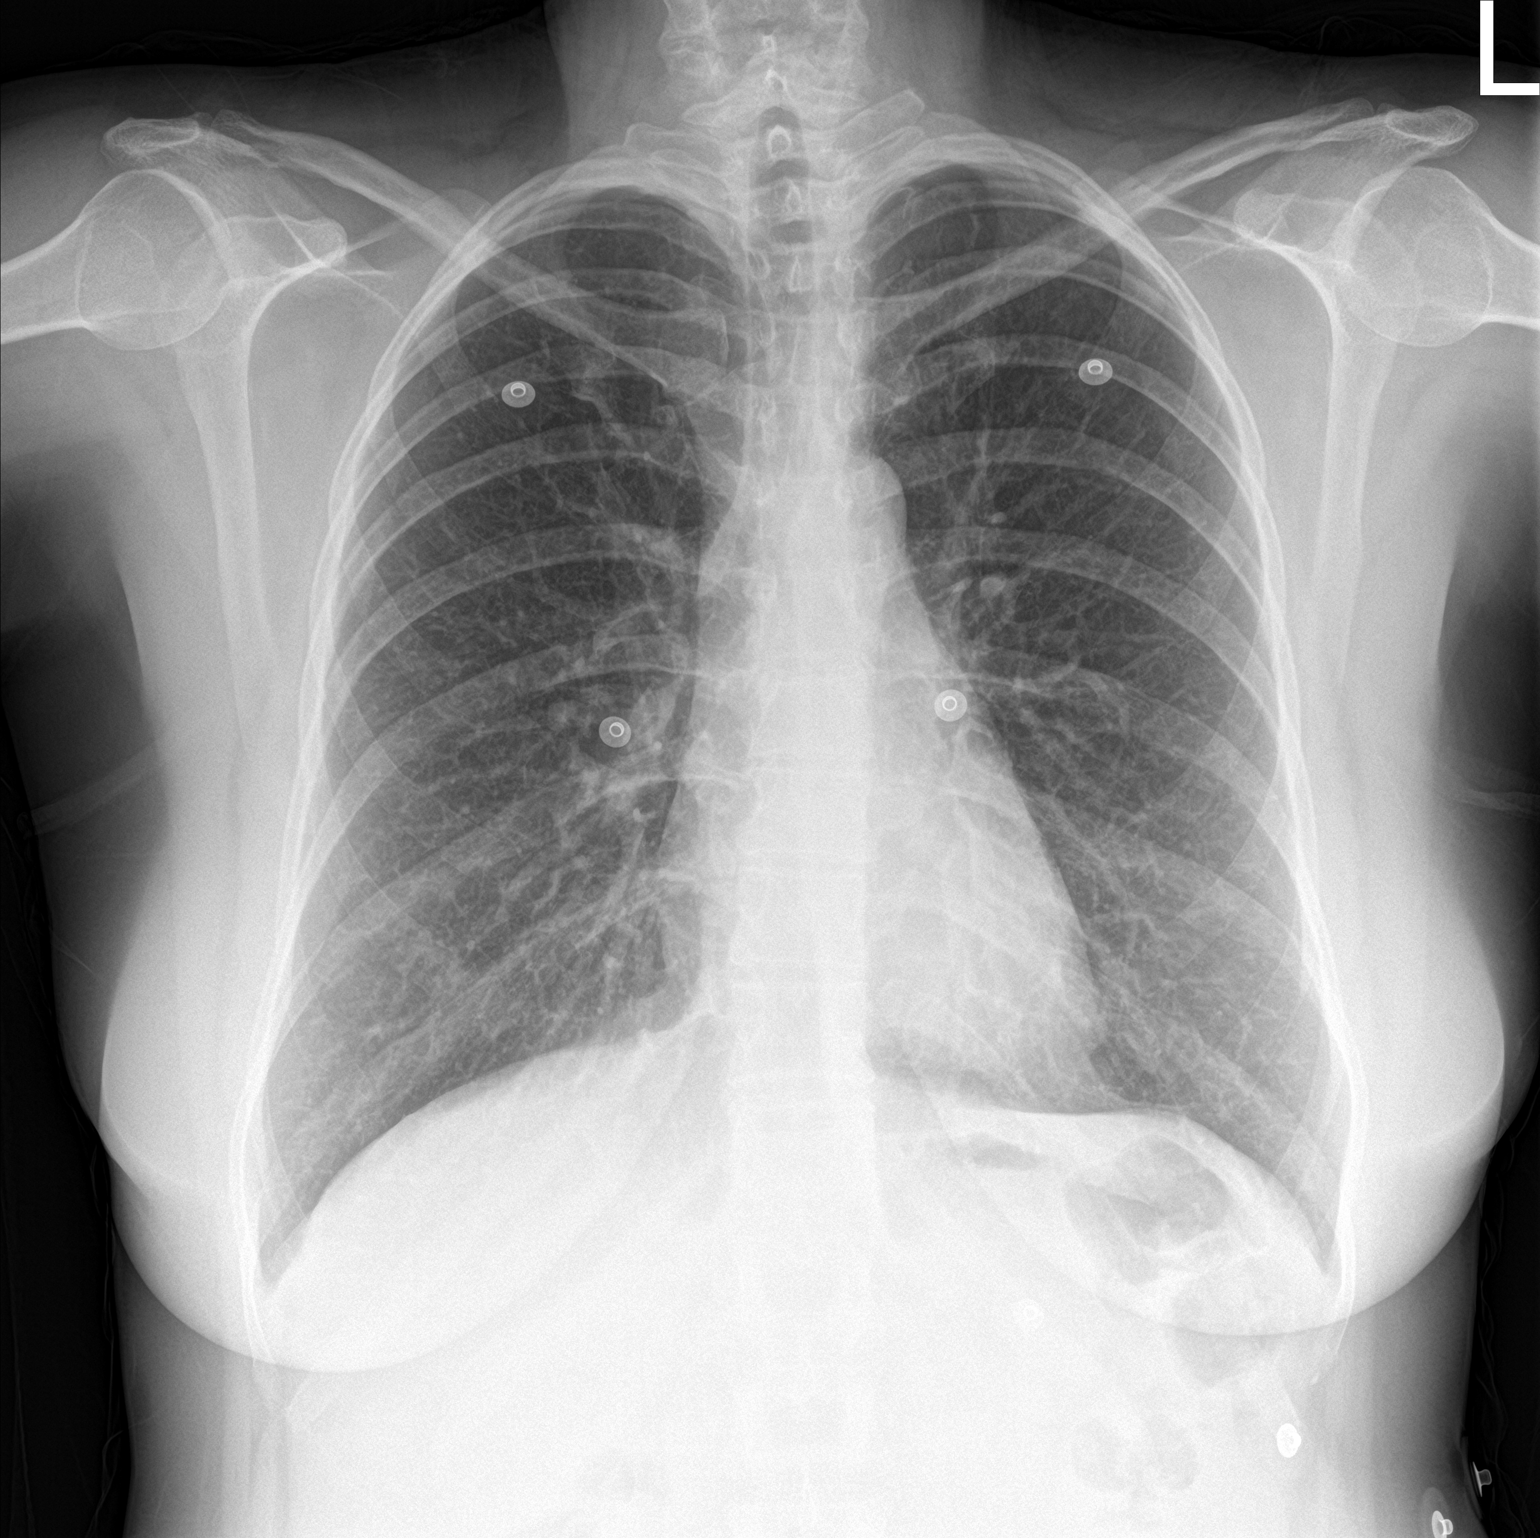

[chest lat]
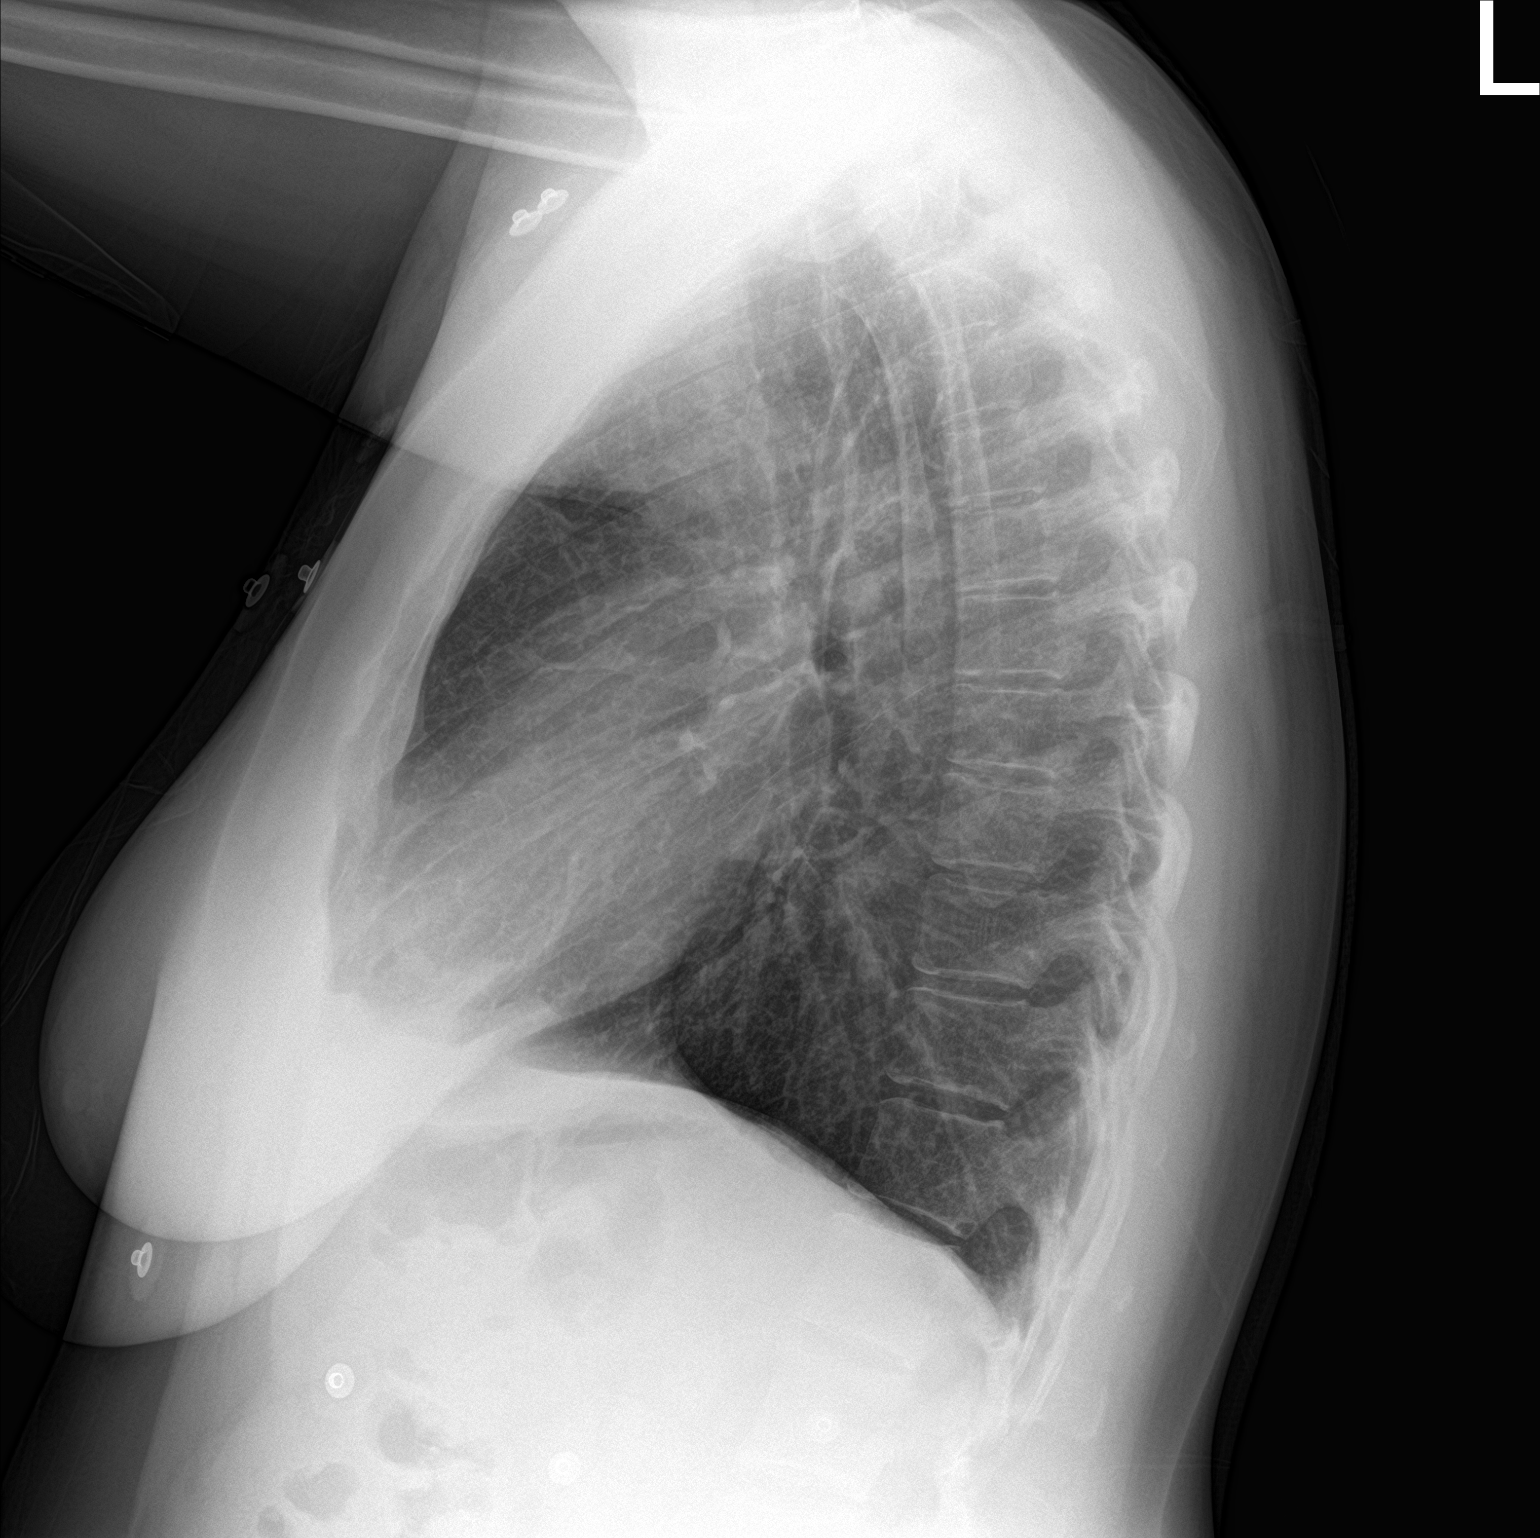

[2 of 2 positions shown; findings below may reference images not displayed]

FINDINGS: The heart size and mediastinal contours are within normal limits.
Both lungs are clear. The visualized skeletal structures are
unremarkable.
IMPRESSION: No active cardiopulmonary disease.
